# Patient Record
Sex: Female | Born: 2000 | Hispanic: Yes | Marital: Single | Smoking: Never smoker
Health system: Southern US, Community
[De-identification: ages and names within clinical notes are randomized; demographics above are authoritative.]

## PROBLEM LIST (undated history)

## (undated) DIAGNOSIS — Z8619 Personal history of other infectious and parasitic diseases: Secondary | ICD-10-CM

## (undated) HISTORY — DX: Personal history of other infectious and parasitic diseases: Z86.19

---
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
## 2020-12-13 ENCOUNTER — Other Ambulatory Visit: Payer: Self-pay

## 2020-12-13 ENCOUNTER — Other Ambulatory Visit (HOSPITAL_COMMUNITY): Payer: Self-pay | Admitting: Occupational Medicine

## 2020-12-13 ENCOUNTER — Ambulatory Visit (HOSPITAL_COMMUNITY)
Admission: RE | Admit: 2020-12-13 | Discharge: 2020-12-13 | Disposition: A | Discharge status: Home / Self Care | Payer: Self-pay | Source: Ambulatory Visit | Attending: Occupational Medicine | Admitting: Occupational Medicine

## 2020-12-13 DIAGNOSIS — R7611 Nonspecific reaction to tuberculin skin test without active tuberculosis: Secondary | ICD-10-CM

## 2021-01-23 ENCOUNTER — Other Ambulatory Visit: Payer: Self-pay

## 2021-01-23 ENCOUNTER — Emergency Department (HOSPITAL_COMMUNITY): Payer: Self-pay

## 2021-01-23 ENCOUNTER — Emergency Department (HOSPITAL_COMMUNITY)
Admission: EM | Admit: 2021-01-23 | Discharge: 2021-01-23 | Disposition: A | Discharge status: Home / Self Care | Payer: Self-pay | Attending: Emergency Medicine | Admitting: Emergency Medicine

## 2021-01-23 ENCOUNTER — Encounter (HOSPITAL_COMMUNITY): Payer: Self-pay | Admitting: Emergency Medicine

## 2021-01-23 DIAGNOSIS — B9689 Other specified bacterial agents as the cause of diseases classified elsewhere: Secondary | ICD-10-CM | POA: Insufficient documentation

## 2021-01-23 DIAGNOSIS — R000 Tachycardia, unspecified: Secondary | ICD-10-CM | POA: Insufficient documentation

## 2021-01-23 DIAGNOSIS — N12 Tubulo-interstitial nephritis, not specified as acute or chronic: Secondary | ICD-10-CM | POA: Insufficient documentation

## 2021-01-23 DIAGNOSIS — R519 Headache, unspecified: Secondary | ICD-10-CM | POA: Insufficient documentation

## 2021-01-23 LAB — URINALYSIS, ROUTINE W REFLEX MICROSCOPIC
Bilirubin Urine: NEGATIVE
Glucose, UA: NEGATIVE mg/dL
Ketones, ur: NEGATIVE mg/dL
Nitrite: NEGATIVE
Protein, ur: 30 mg/dL — AB
Specific Gravity, Urine: 1.023 (ref 1.005–1.030)
pH: 5 (ref 5.0–8.0)

## 2021-01-23 LAB — COMPREHENSIVE METABOLIC PANEL
ALT: 22 U/L (ref 0–44)
AST: 27 U/L (ref 15–41)
Albumin: 3.7 g/dL (ref 3.5–5.0)
Alkaline Phosphatase: 85 U/L (ref 38–126)
Anion gap: 10 (ref 5–15)
BUN: 11 mg/dL (ref 6–20)
CO2: 21 mmol/L — ABNORMAL LOW (ref 22–32)
Calcium: 8.3 mg/dL — ABNORMAL LOW (ref 8.9–10.3)
Chloride: 103 mmol/L (ref 98–111)
Creatinine, Ser: 0.61 mg/dL (ref 0.44–1.00)
GFR, Estimated: >60 mL/min (ref >60)
Glucose, Bld: 94 mg/dL (ref 70–99)
Potassium: 3.6 mmol/L (ref 3.5–5.1)
Sodium: 134 mmol/L — ABNORMAL LOW (ref 135–145)
Total Bilirubin: 0.8 mg/dL (ref 0.3–1.2)
Total Protein: 7.9 g/dL (ref 6.5–8.1)

## 2021-01-23 LAB — CBC WITH DIFFERENTIAL/PLATELET
Abs Immature Granulocytes: 0.06 10*3/uL (ref 0.00–0.07)
Basophils Absolute: 0.1 10*3/uL (ref 0.0–0.1)
Basophils Relative: 0 %
Eosinophils Absolute: 0 10*3/uL (ref 0.0–0.5)
Eosinophils Relative: 0 %
HCT: 35.6 % — ABNORMAL LOW (ref 36.0–46.0)
Hemoglobin: 11.2 g/dL — ABNORMAL LOW (ref 12.0–15.0)
Immature Granulocytes: 1 %
Lymphocytes Relative: 10 %
Lymphs Abs: 1.3 10*3/uL (ref 0.7–4.0)
MCH: 26 pg (ref 26.0–34.0)
MCHC: 31.5 g/dL (ref 30.0–36.0)
MCV: 82.6 fL (ref 80.0–100.0)
Monocytes Absolute: 1.1 10*3/uL — ABNORMAL HIGH (ref 0.1–1.0)
Monocytes Relative: 9 %
Neutro Abs: 9.9 10*3/uL — ABNORMAL HIGH (ref 1.7–7.7)
Neutrophils Relative %: 80 %
Platelets: 289 10*3/uL (ref 150–400)
RBC: 4.31 MIL/uL (ref 3.87–5.11)
RDW: 15.5 % (ref 11.5–15.5)
WBC: 12.3 10*3/uL — ABNORMAL HIGH (ref 4.0–10.5)
nRBC: 0 % (ref 0.0–0.2)

## 2021-01-23 LAB — PREGNANCY, URINE: Preg Test, Ur: NEGATIVE

## 2021-01-23 LAB — LIPASE, BLOOD: Lipase: 25 U/L (ref 11–51)

## 2021-01-23 MED ORDER — SODIUM CHLORIDE 0.9 % IV BOLUS
1000.0000 mL | Freq: Once | INTRAVENOUS | Status: AC
  Administered 2021-01-23: 1000 mL via INTRAVENOUS

## 2021-01-23 MED ORDER — SULFAMETHOXAZOLE-TRIMETHOPRIM 800-160 MG PO TABS: 1.0000 | ORAL_TABLET | Freq: Two times a day (BID) | ORAL | 0 refills | Status: AC

## 2021-01-23 MED ORDER — SODIUM CHLORIDE 0.9 % IV SOLN
1.0000 g | Freq: Once | INTRAVENOUS | Status: AC
  Administered 2021-01-23: 1 g via INTRAVENOUS
  Filled 2021-01-23: qty 10

## 2021-01-23 MED ORDER — ACETAMINOPHEN 500 MG PO TABS
1000.0000 mg | ORAL_TABLET | Freq: Once | ORAL | Status: AC
Start: 2021-01-23 — End: 2021-01-23
  Administered 2021-01-23: 1000 mg via ORAL
  Filled 2021-01-23: qty 2

## 2021-01-23 MED ORDER — IOHEXOL 300 MG/ML  SOLN
100.0000 mL | Freq: Once | INTRAMUSCULAR | Status: AC | PRN
  Administered 2021-01-23: 100 mL via INTRAVENOUS

## 2021-01-23 MED ORDER — IBUPROFEN 600 MG PO TABS: 600.0000 mg | ORAL_TABLET | Freq: Four times a day (QID) | ORAL | 0 refills | Status: AC | PRN

## 2021-01-23 MED ORDER — KETOROLAC TROMETHAMINE 30 MG/ML IJ SOLN
30.0000 mg | Freq: Once | INTRAMUSCULAR | Status: AC
  Administered 2021-01-23: 30 mg via INTRAVENOUS
  Filled 2021-01-23: qty 1

## 2021-01-23 NOTE — ED Triage Notes (Signed)
Pt c/o left flank pain since Friday and chills started yesterday. Pt states she has been having headaches a lot.

## 2021-01-23 NOTE — ED Provider Notes (Signed)
[facility] EMERGENCY DEPARTMENT Provider Note   CSN: [id number] Arrival date & time: 01/23/21  1857     History Chief Complaint  Patient presents with   Fever    Claudia Cook is a 20 y.o. female.  Pt presents to the ED today with fever and chills and abdominal pain.  Sx started Friday, 7/15.  She did have Covid about 2 weeks ago.  She works here in [employer] and has been working all day.  Sx getting worse, so she checked in.  She has had some headaches, but no sob.  No more cough.       History reviewed. No pertinent past medical history.  There are no problems to display for this patient.   History reviewed. No pertinent surgical history.   OB History   No obstetric history on file.     No family history on file.  Social History   Tobacco Use   Smoking status: Never   Smokeless tobacco: Never  Vaping Use   Vaping Use: Never used  Substance Use Topics   Alcohol use: Yes    Comment: occ   Drug use: Never    Home Medications Prior to Admission medications   Medication Sig Start Date End Date Taking? Authorizing Provider  ibuprofen (ADVIL) 600 MG tablet Take 1 tablet (600 mg total) by mouth every 6 (six) hours as needed. 01/23/21  Yes Isla Pence, MD  sulfamethoxazole-trimethoprim (BACTRIM DS) 800-160 MG tablet Take 1 tablet by mouth 2 (two) times daily for 7 days. 01/23/21 01/30/21 Yes Isla Pence, MD  ID NOW COVID-19 KIT TEST AS DIRECTED TODAY 01/15/21   [provider]    Allergies    Patient has no known allergies.  Review of Systems   Review of Systems  Constitutional:  Positive for chills and fever.  Gastrointestinal:  Positive for abdominal pain.  All other systems reviewed and are negative.  Physical Exam Updated Vital Signs BP 140/87 (BP Location: Right Arm)   Pulse (!) 122   Temp (!) 101.1 F (38.4 C) (Oral)   Resp 17   Ht _0  (1.6 m)   Wt 81.6 kg   LMP 11/18/2020   SpO2 100%   BMI 31.89 kg/m   Physical  Exam Vitals and nursing note reviewed.  Constitutional:      Appearance: Normal appearance.  HENT:     Head: Normocephalic and atraumatic.     Right Ear: External ear normal.     Left Ear: External ear normal.     Nose: Nose normal.     Mouth/Throat:     Mouth: Mucous membranes are dry.  Eyes:     Extraocular Movements: Extraocular movements intact.     Conjunctiva/sclera: Conjunctivae normal.     Pupils: Pupils are equal, round, and reactive to light.  Cardiovascular:     Rate and Rhythm: Regular rhythm. Tachycardia present.     Pulses: Normal pulses.     Heart sounds: Normal heart sounds.  Pulmonary:     Effort: Pulmonary effort is normal.     Breath sounds: Normal breath sounds.  Abdominal:     General: Abdomen is flat. Bowel sounds are normal.     Palpations: Abdomen is soft.     Tenderness: There is abdominal tenderness in the left lower quadrant.  Musculoskeletal:        General: Normal range of motion.     Cervical back: Normal range of motion and neck supple.  Skin:  General: Skin is warm.     Capillary Refill: Capillary refill takes less than 2 seconds.  Neurological:     General: No focal deficit present.     Mental Status: She is alert and oriented to person, place, and time.  Psychiatric:        Mood and Affect: Mood normal.        Behavior: Behavior normal.        Thought Content: Thought content normal.        Judgment: Judgment normal.    ED Results / Procedures / Treatments   Labs (all labs ordered are listed, but only abnormal results are displayed) Labs Reviewed  CBC WITH DIFFERENTIAL/PLATELET - Abnormal; Notable for the following components:      Result Value   WBC 12.3 (*)    Hemoglobin 11.2 (*)    HCT 35.6 (*)    Neutro Abs 9.9 (*)    Monocytes Absolute 1.1 (*)    All other components within normal limits  COMPREHENSIVE METABOLIC PANEL - Abnormal; Notable for the following components:   Sodium 134 (*)    CO2 21 (*)    Calcium 8.3 (*)     All other components within normal limits  URINALYSIS, ROUTINE W REFLEX MICROSCOPIC - Abnormal; Notable for the following components:   APPearance HAZY (*)    Hgb urine dipstick MODERATE (*)    Protein, ur 30 (*)    Leukocytes,Ua SMALL (*)    Bacteria, UA RARE (*)    All other components within normal limits  LIPASE, BLOOD  PREGNANCY, URINE    EKG None  Radiology CT Abdomen Pelvis W Contrast  Result Date: 01/23/2021 CLINICAL DATA:  Left-sided abdominal pain and nausea for 1 week EXAM: CT ABDOMEN AND PELVIS WITH CONTRAST TECHNIQUE: Multidetector CT imaging of the abdomen and pelvis was performed using the standard protocol following bolus administration of intravenous contrast. CONTRAST:  140m OMNIPAQUE IOHEXOL 300 MG/ML  SOLN COMPARISON:  None. FINDINGS: Lower chest: No acute pleural or parenchymal lung disease. Hepatobiliary: No focal liver abnormality is seen. No gallstones, gallbladder wall thickening, or biliary dilatation. Pancreas: Unremarkable. No pancreatic ductal dilatation or surrounding inflammatory changes. Spleen: Normal in size without focal abnormality. Adrenals/Urinary Tract: There is heterogeneous decreased enhancement mid left kidney, best seen on image 33, consistent with focal pyelonephritis. No evidence of renal abscess. The right kidney enhances normally. No urinary tract calculi or obstructive uropathy. The adrenals and bladder are unremarkable. Stomach/Bowel: No bowel obstruction or ileus. Normal appendix right lower quadrant. No bowel wall thickening or inflammatory change. Vascular/Lymphatic: No significant vascular findings are present. No enlarged abdominal or pelvic lymph nodes. Reproductive: Uterus and bilateral adnexa are unremarkable. Other: No free fluid or free intraperitoneal gas. No abdominal wall hernia. Musculoskeletal: No acute or destructive bony lesions. Reconstructed images demonstrate no additional findings. IMPRESSION: 1. Focal left pyelonephritis  without evidence of renal abscess. 2. Otherwise unremarkable CT of the abdomen and pelvis. Normal appendix. Electronically Signed   By: MRanda NgoM.D.   On: 01/23/2021 20:43   DG Chest Portable 1 View  Result Date: 01/23/2021 CLINICAL DATA:  Fever EXAM: PORTABLE CHEST 1 VIEW COMPARISON:  12/13/2020 FINDINGS: The heart size and mediastinal contours are within normal limits. Both lungs are clear. The visualized skeletal structures are unremarkable. IMPRESSION: No active disease. Electronically Signed   By: KDonavan FoilM.D.   On: 01/23/2021 20:07    Procedures Procedures   Medications Ordered in ED Medications  cefTRIAXone (ROCEPHIN)  1 g in sodium chloride 0.9 % 100 mL IVPB (has no administration in time range)  ketorolac (TORADOL) 30 MG/ML injection 30 mg (30 mg Intravenous Given 01/23/21 1943)  sodium chloride 0.9 % bolus 1,000 mL (1,000 mLs Intravenous New Bag/Given 01/23/21 1941)  iohexol (OMNIPAQUE) 300 MG/ML solution 100 mL (100 mLs Intravenous Contrast Given 01/23/21 2039)    ED Course  I have reviewed the triage vital signs and the nursing notes.  Pertinent labs & imaging results that were available during my care of the patient were reviewed by me and considered in my medical decision making (see chart for details).    MDM Rules/Calculators/A&P                           Pt does have a left pyelonephritis.  She is feeling better after fluids and fever tx.  She is given a dose of rocephin and then d/c.  She is to return if worse. Final Clinical Impression(s) / ED Diagnoses Final diagnoses:  Pyelonephritis    Rx / DC Orders ED Discharge Orders          Ordered    sulfamethoxazole-trimethoprim (BACTRIM DS) 800-160 MG tablet  2 times daily        01/23/21 2050    ibuprofen (ADVIL) 600 MG tablet  Every 6 hours PRN        01/23/21 2050             Isla Pence, MD 01/23/21 2051

## 2021-01-23 NOTE — ED Notes (Addendum)
md notified of temp, tylenol given. Pt reports decreased pain, states that she is ready to go home

## 2021-01-23 NOTE — ED Notes (Signed)
Pt was covid positive last week.

## 2022-01-27 ENCOUNTER — Ambulatory Visit
Admission: EM | Admit: 2022-01-27 | Discharge: 2022-01-27 | Disposition: A | Discharge status: Home / Self Care | Payer: BC Managed Care – PPO | Attending: Family Medicine | Admitting: Family Medicine

## 2022-01-27 DIAGNOSIS — N926 Irregular menstruation, unspecified: Secondary | ICD-10-CM

## 2022-01-27 LAB — POCT URINALYSIS DIP (MANUAL ENTRY)
Bilirubin, UA: NEGATIVE
Glucose, UA: NEGATIVE mg/dL
Ketones, POC UA: NEGATIVE mg/dL
Leukocytes, UA: NEGATIVE
Nitrite, UA: NEGATIVE
Spec Grav, UA: >=1.03 — AB (ref 1.010–1.025)
Urobilinogen, UA: 0.2 E.U./dL
pH, UA: 5.5 (ref 5.0–8.0)

## 2022-01-27 LAB — POCT URINE PREGNANCY: Preg Test, Ur: NEGATIVE

## 2022-01-27 MED ORDER — MEGESTROL ACETATE 40 MG PO TABS: 40.0000 mg | ORAL_TABLET | Freq: Every day | ORAL | 0 refills | Status: DC

## 2022-01-27 NOTE — ED Provider Notes (Signed)
[facility]    CSN: [id number] Arrival date & time: 01/27/22  1557      History   Chief Complaint Chief Complaint  Patient presents with   Vaginal Bleeding    HPI Claudia Cook is a 21 y.o. female.   Presenting today with extended period of vaginal bleeding following her menstrual period last month.  She states she had a normal type.  On June 20, then 3 days of no bleeding followed by ongoing waxing waning bleeding since.  She states the bleeding started getting heavier today.  Denies abdominal pain, pelvic pain, vaginal discharge or irritation, nausea, vomiting, bowel changes.  Has never had an issue like this in the past.  No new medications, supplements.  The only thing out of the ordinary recently is that she has been trying to lose some weight.    No past medical history on file.  There are no problems to display for this patient.   No past surgical history on file.  OB History   No obstetric history on file.      Home Medications    Prior to Admission medications   Medication Sig Start Date End Date Taking? Authorizing Provider  megestrol (MEGACE) 40 MG tablet Take 1 tablet (40 mg total) by mouth daily. 01/27/22  Yes Volney American, PA-C  ibuprofen (ADVIL) 600 MG tablet Take 1 tablet (600 mg total) by mouth every 6 (six) hours as needed. 01/23/21   Isla Pence, MD  ID NOW COVID-19 KIT TEST AS DIRECTED TODAY 01/15/21   [provider]    Family History No family history on file.  Social History Social History   Tobacco Use   Smoking status: Never   Smokeless tobacco: Never  Vaping Use   Vaping Use: Never used  Substance Use Topics   Alcohol use: Yes    Comment: occ   Drug use: Never     Allergies   Patient has no known allergies.   Review of Systems Review of Systems Per HPI  Physical Exam Triage Vital Signs ED Triage Vitals [01/27/22 1738]  Enc Vitals Group     BP 108/71     Pulse Rate 72     Resp 14      Temp 98 F (36.7 C)     Temp Source Oral     SpO2 98 %     Weight      Height      Head Circumference      Peak Flow      Pain Score      Pain Loc      Pain Edu?      Excl. in [place]?    No data found.  Updated Vital Signs BP 108/71 (BP Location: Right Arm)   Pulse 72   Temp 98 F (36.7 C) (Oral)   Resp 14   SpO2 98%   Visual Acuity Right Eye Distance:   Left Eye Distance:   Bilateral Distance:    Right Eye Near:   Left Eye Near:    Bilateral Near:     Physical Exam Vitals and nursing note reviewed.  Constitutional:      Appearance: Normal appearance. She is not ill-appearing.  HENT:     Head: Atraumatic.  Eyes:     Extraocular Movements: Extraocular movements intact.     Conjunctiva/sclera: Conjunctivae normal.  Cardiovascular:     Rate and Rhythm: Normal rate and regular rhythm.     Heart  sounds: Normal heart sounds.  Pulmonary:     Effort: Pulmonary effort is normal.     Breath sounds: Normal breath sounds.  Abdominal:     General: Bowel sounds are normal. There is no distension.     Palpations: Abdomen is soft.     Tenderness: There is no abdominal tenderness. There is no guarding.  Genitourinary:    Comments: GU exam declined Musculoskeletal:        General: Normal range of motion.     Cervical back: Normal range of motion and neck supple.  Skin:    General: Skin is warm and dry.  Neurological:     Mental Status: She is alert and oriented to person, place, and time.  Psychiatric:        Mood and Affect: Mood normal.        Thought Content: Thought content normal.        Judgment: Judgment normal.      UC Treatments / Results  Labs (all labs ordered are listed, but only abnormal results are displayed) Labs Reviewed  POCT URINALYSIS DIP (MANUAL ENTRY) - Abnormal; Notable for the following components:      Result Value   Spec Grav, UA >=1.030 (*)    Blood, UA large (*)    Protein Ur, POC trace (*)    All other components within normal  limits  POCT URINE PREGNANCY    EKG   Radiology No results found.  Procedures Procedures (including critical care time)  Medications Ordered in UC Medications - No data to display  Initial Impression / Assessment and Plan / UC Course  I have reviewed the triage vital signs and the nursing notes.  Pertinent labs & imaging results that were available during my care of the patient were reviewed by me and considered in my medical decision making (see chart for details).     Urinalysis without evidence of urinary tract infection, urine pregnant negative, vaginal swab declined.  Exam and vital signs benign and reassuring.  We will treat with Megace while waiting to establish care with OB/GYN which she states is next month.  Discussed iron supplement, fluids, close monitoring for worsening symptoms.  Final Clinical Impressions(s) / UC Diagnoses   Final diagnoses:  Abnormal menstruation     Discharge Instructions      Drink plenty of water, take an iron supplement and follow-up with OB/GYN as soon as possible.    ED Prescriptions     Medication Sig Dispense Auth. Provider   megestrol (MEGACE) 40 MG tablet Take 1 tablet (40 mg total) by mouth daily. 21 tablet Volney American, [place]      PDMP not reviewed this encounter.   Volney American, [place] 01/27/22 1815

## 2022-01-27 NOTE — ED Triage Notes (Signed)
Pt states she had a normal period on June 20 then began bleeding apprx 3 days after, denies excessive bleeding or clods, mild abdominal discomfort

## 2022-01-27 NOTE — Discharge Instructions (Signed)
Drink plenty of water, take an iron supplement and follow-up with OB/GYN as soon as possible.

## 2022-03-20 ENCOUNTER — Other Ambulatory Visit (HOSPITAL_COMMUNITY)
Admission: RE | Admit: 2022-03-20 | Discharge: 2022-03-20 | Disposition: A | Discharge status: Home / Self Care | Payer: BC Managed Care – PPO | Source: Ambulatory Visit | Attending: Obstetrics & Gynecology | Admitting: Obstetrics & Gynecology

## 2022-03-20 ENCOUNTER — Encounter: Payer: Self-pay | Admitting: Obstetrics & Gynecology

## 2022-03-20 ENCOUNTER — Ambulatory Visit (INDEPENDENT_AMBULATORY_CARE_PROVIDER_SITE_OTHER): Payer: BC Managed Care – PPO | Admitting: Obstetrics & Gynecology

## 2022-03-20 VITALS — BP 101/68 | HR 93 | Ht 63.0 in | Wt 219.8 lb

## 2022-03-20 DIAGNOSIS — Z30011 Encounter for initial prescription of contraceptive pills: Secondary | ICD-10-CM | POA: Diagnosis not present

## 2022-03-20 DIAGNOSIS — Z113 Encounter for screening for infections with a predominantly sexual mode of transmission: Secondary | ICD-10-CM

## 2022-03-20 DIAGNOSIS — Z01419 Encounter for gynecological examination (general) (routine) without abnormal findings: Secondary | ICD-10-CM

## 2022-03-20 MED ORDER — NORETHIN ACE-ETH ESTRAD-FE 1-20 MG-MCG(24) PO TABS: 1.0000 | ORAL_TABLET | Freq: Every day | ORAL | 4 refills | Status: DC

## 2022-03-20 NOTE — Progress Notes (Signed)
WELL-WOMAN EXAMINATION Patient name: Claudia Cook MRN [id number]  Date of birth: 2001-05-24 Chief Complaint:   Gynecologic Exam  History of Present Illness:   Claudia Cook is a 21 y.o. G0P0000  female being seen today for a routine well-woman exam and the following concerns:  Menses have been heavy for the past 3 mos- typically lasting about 6 days, changing super pad about 3x per day.  Then in August, she had a regular period, stopped for a few days then restarted with moderate to heavy bleeding.  Taking Megace when the bleeding is heavy and currently is still taking on occasion.  Sexually active for the past several months, not using protection.  She does not desire a pregnancy at this time.  Other than irregular bleeding, no other acute concerns.  Denies pelvic or abdominal pain.  Denies itching or irritation.  The current method of family planning is none.    Last pap collected today.  Last mammogram: n/a. Last colonoscopy: n/a     03/20/2022    3:01 PM  Depression screen PHQ 2/9  Decreased Interest 0  Down, Depressed, Hopeless 0  PHQ - 2 Score 0  Altered sleeping 0  Tired, decreased energy 1  Change in appetite 0  Feeling bad or failure about yourself  0  Trouble concentrating 0  Moving slowly or fidgety/restless 0  Suicidal thoughts 0  PHQ-9 Score 1    Review of Systems:   Pertinent items are noted in HPI Denies any headaches, blurred vision, fatigue, shortness of breath, chest pain, abdominal pain, bowel movements, urination, or intercourse unless otherwise stated above.  Pertinent History Reviewed:  Reviewed past medical,surgical, social and family history.  Reviewed problem list, medications and allergies. Physical Assessment:   Vitals:   03/20/22 1459  BP: 101/68  Pulse: 93  Weight: 219 lb 12.8 oz (99.7 kg)  Height: 5\' 3"  (1.6 m)  Body mass index is 38.94 kg/m.        Physical Examination:   General appearance - well appearing, and in no  distress  Mental status - alert, oriented to person, place, and time  Psych:  She has a normal mood and affect  Skin - warm and dry, normal color, no suspicious lesions noted  Chest - effort normal, all lung fields clear to auscultation bilaterally  Heart - normal rate and regular rhythm  Neck:  midline trachea, no thyromegaly or nodules  Breasts - breasts appear normal, no suspicious masses, no skin or nipple changes or  axillary nodes  Abdomen - obese, soft, nontender, nondistended, no masses or organomegaly  Pelvic - VULVA: normal appearing vulva with no masses, tenderness or lesions  VAGINA: normal appearing vagina with normal color and discharge, no lesions  CERVIX: normal appearing cervix without discharge or lesions, no CMT  Thin prep pap is done with HR HPV cotesting  UTERUS: uterus is felt to be normal size, shape, consistency and nontender   ADNEXA: No adnexal masses or tenderness noted.  Extremities:  No swelling or varicosities noted  Chaperone:  pt declined      Assessment & Plan:  1) Well-Woman Exam -pap collected, reviewed screening guidelines -STI screening to be completed  2) AUB/Contraceptive management -reviewed options both for contraception and to regulate her period -reviewed options from pill, patch, ring to LARCs -plan to start on OCPs, f/u in 3 mos.  OCP risk assessment: Pt denies personal history of VTE, stroke or heart attack.  Denies personal h/o breast cancer.  Pt is either a non-smoker or smoker under the age of 23yo.  Denies h/o migraines with aura  Meds ordered this encounter  Medications   Norethindrone Acetate-Ethinyl Estrad-FE (LOESTRIN 24 FE) 1-20 MG-MCG(24) tablet    Sig: Take 1 tablet by mouth daily.    Dispense:  90 tablet    Refill:  4    Follow-up: Return in about 3 months (around 06/19/2022) for Medication follow up.   Myna Hidalgo, DO Attending Obstetrician & Gynecologist, [facility] for [employer], [facility]

## 2022-03-21 LAB — RPR: RPR Ser Ql: NONREACTIVE

## 2022-03-21 LAB — HIV ANTIBODY (ROUTINE TESTING W REFLEX): HIV Screen 4th Generation wRfx: NONREACTIVE

## 2022-03-25 LAB — CYTOLOGY - PAP
Chlamydia: POSITIVE — AB
Comment: NEGATIVE
Comment: NORMAL
Diagnosis: NEGATIVE
Neisseria Gonorrhea: NEGATIVE

## 2022-03-26 ENCOUNTER — Other Ambulatory Visit: Payer: Self-pay | Admitting: Obstetrics & Gynecology

## 2022-03-26 DIAGNOSIS — A749 Chlamydial infection, unspecified: Secondary | ICD-10-CM

## 2022-03-26 MED ORDER — AZITHROMYCIN 500 MG PO TABS: 1000.0000 mg | ORAL_TABLET | Freq: Once | ORAL | 1 refills | Status: AC

## 2022-03-26 NOTE — Progress Notes (Signed)
Rx for Chlamydia treatment

## 2022-04-01 ENCOUNTER — Encounter: Payer: Self-pay | Admitting: Obstetrics & Gynecology

## 2022-04-02 ENCOUNTER — Emergency Department (HOSPITAL_COMMUNITY)
Admission: EM | Admit: 2022-04-02 | Discharge: 2022-04-03 | Disposition: A | Discharge status: Home / Self Care | Payer: BC Managed Care – PPO | Source: Home / Self Care | Attending: Emergency Medicine | Admitting: Emergency Medicine

## 2022-04-02 ENCOUNTER — Emergency Department (HOSPITAL_COMMUNITY)
Admission: EM | Admit: 2022-04-02 | Discharge: 2022-04-02 | Discharge status: Against Medical Advice | Payer: BC Managed Care – PPO | Attending: Emergency Medicine | Admitting: Emergency Medicine

## 2022-04-02 ENCOUNTER — Other Ambulatory Visit: Payer: Self-pay

## 2022-04-02 ENCOUNTER — Encounter (HOSPITAL_COMMUNITY): Payer: Self-pay | Admitting: *Deleted

## 2022-04-02 DIAGNOSIS — N939 Abnormal uterine and vaginal bleeding, unspecified: Secondary | ICD-10-CM | POA: Insufficient documentation

## 2022-04-02 DIAGNOSIS — Z5321 Procedure and treatment not carried out due to patient leaving prior to being seen by health care provider: Secondary | ICD-10-CM | POA: Insufficient documentation

## 2022-04-02 DIAGNOSIS — R110 Nausea: Secondary | ICD-10-CM | POA: Insufficient documentation

## 2022-04-02 DIAGNOSIS — R519 Headache, unspecified: Secondary | ICD-10-CM | POA: Diagnosis not present

## 2022-04-02 LAB — BASIC METABOLIC PANEL
Anion gap: 7 (ref 5–15)
BUN: 10 mg/dL (ref 6–20)
CO2: 23 mmol/L (ref 22–32)
Calcium: 8.4 mg/dL — ABNORMAL LOW (ref 8.9–10.3)
Chloride: 110 mmol/L (ref 98–111)
Creatinine, Ser: 0.65 mg/dL (ref 0.44–1.00)
GFR, Estimated: >60 mL/min (ref >60)
Glucose, Bld: 104 mg/dL — ABNORMAL HIGH (ref 70–99)
Potassium: 3.3 mmol/L — ABNORMAL LOW (ref 3.5–5.1)
Sodium: 140 mmol/L (ref 135–145)

## 2022-04-02 LAB — CBC
HCT: 33.8 % — ABNORMAL LOW (ref 36.0–46.0)
Hemoglobin: 10.5 g/dL — ABNORMAL LOW (ref 12.0–15.0)
MCH: 26.9 pg (ref 26.0–34.0)
MCHC: 31.1 g/dL (ref 30.0–36.0)
MCV: 86.7 fL (ref 80.0–100.0)
Platelets: 317 10*3/uL (ref 150–400)
RBC: 3.9 MIL/uL (ref 3.87–5.11)
RDW: 13.9 % (ref 11.5–15.5)
WBC: 8.6 10*3/uL (ref 4.0–10.5)
nRBC: 0 % (ref 0.0–0.2)

## 2022-04-02 LAB — CBC WITH DIFFERENTIAL/PLATELET
Abs Immature Granulocytes: 0.02 10*3/uL (ref 0.00–0.07)
Basophils Absolute: 0 10*3/uL (ref 0.0–0.1)
Basophils Relative: 1 %
Eosinophils Absolute: 0.2 10*3/uL (ref 0.0–0.5)
Eosinophils Relative: 3 %
HCT: 35.5 % — ABNORMAL LOW (ref 36.0–46.0)
Hemoglobin: 11.4 g/dL — ABNORMAL LOW (ref 12.0–15.0)
Immature Granulocytes: 0 %
Lymphocytes Relative: 30 %
Lymphs Abs: 2.2 10*3/uL (ref 0.7–4.0)
MCH: 27.3 pg (ref 26.0–34.0)
MCHC: 32.1 g/dL (ref 30.0–36.0)
MCV: 85.1 fL (ref 80.0–100.0)
Monocytes Absolute: 0.4 10*3/uL (ref 0.1–1.0)
Monocytes Relative: 5 %
Neutro Abs: 4.5 10*3/uL (ref 1.7–7.7)
Neutrophils Relative %: 61 %
Platelets: 335 10*3/uL (ref 150–400)
RBC: 4.17 MIL/uL (ref 3.87–5.11)
RDW: 13.8 % (ref 11.5–15.5)
WBC: 7.4 10*3/uL (ref 4.0–10.5)
nRBC: 0 % (ref 0.0–0.2)

## 2022-04-02 LAB — TYPE AND SCREEN
ABO/RH(D): O POS
Antibody Screen: NEGATIVE

## 2022-04-02 NOTE — ED Triage Notes (Signed)
Pt with vaginal bleeding x 2 months, started on birth control 2 weeks ago and passing large clots. + nausea and HA.

## 2022-04-02 NOTE — ED Provider Triage Note (Signed)
Emergency Medicine Provider Triage Evaluation Note  Claudia Cook , a 21 y.o. female  was evaluated in triage.  Pt complains of persistent vaginal bleeding.  Patient states she has had it for several weeks now.  She did see an OB/GYN doctor and was started on birth control pills.  She feels like her bleeding has gotten worse..  She sent a message to her OB/GYN doctor and they told her this is not unusual after starting birth control pills.  Patient states she continue to pass clots so she came to the ED  Review of Systems  Positive: Vaginal bleeding Negative: Abdominal pain  Physical Exam  BP (!) 142/69 (BP Location: Right Arm)   Pulse 73   Temp 98.1 F (36.7 C) (Oral)   Resp 20   Ht 1.6 m (5\' 3" )   Wt 100.2 kg   SpO2 99%   BMI 39.15 kg/m  Gen:   Awake, no distress   Resp:  Normal effort  MSK:   Moves extremities without difficulty  Other:    Medical Decision Making  Medically screening exam initiated at 10:44 PM.  Appropriate orders placed.  PATRCIA SCHNEPP was informed that the remainder of the evaluation will be completed by another provider, this initial triage assessment does not replace that evaluation, and the importance of remaining in the ED until their evaluation is complete.  We will check CBC to rule out severe anemia.   Dorie Rank, MD 04/02/22 2245

## 2022-04-02 NOTE — ED Triage Notes (Signed)
Pt states her vaginal bleeding is worse.  Pt states she is having to change her pad every hour. Clots are getting worse.

## 2022-04-03 LAB — HCG, QUANTITATIVE, PREGNANCY: hCG, Beta Chain, Quant, S: <1 m[IU]/mL (ref <5)

## 2022-04-03 NOTE — ED Provider Notes (Signed)
[facility] EMERGENCY DEPARTMENT Provider Note   CSN: [id number] Arrival date & time: 04/02/22  2033     History  Chief Complaint  Patient presents with   Vaginal Bleeding    Claudia Cook is a 21 y.o. female.  This patient is a 21 year old female with history of irregular menstrual bleeding.  Patient was started 2 weeks ago on oral contraceptives to attempt to control her cycle.  Since taking this medication she reports passing clots.  She denies to me that she is having any fevers, chills, or abdominal pain.  She did just have a pelvic exam performed prior to be starting on these medications.  She denies any discharge.  She reports that is difficult to work due to this bleeding.  The history is provided by the patient.       Home Medications Prior to Admission medications   Medication Sig Start Date End Date Taking? Authorizing Provider  ibuprofen (ADVIL) 600 MG tablet Take 1 tablet (600 mg total) by mouth every 6 (six) hours as needed. Patient not taking: Reported on 03/20/2022 01/23/21   Isla Pence, MD  Norethindrone Acetate-Ethinyl Estrad-FE (LOESTRIN 24 FE) 1-20 MG-MCG(24) tablet Take 1 tablet by mouth daily. 03/20/22 06/18/22  Janyth Pupa, DO      Allergies    Patient has no known allergies.    Review of Systems   Review of Systems  All other systems reviewed and are negative.   Physical Exam Updated Vital Signs BP (!) 142/69 (BP Location: Right Arm)   Pulse 73   Temp 98.1 F (36.7 C) (Oral)   Resp 20   Ht 5\' 3"  (1.6 m)   Wt 100.2 kg   SpO2 99%   BMI 39.15 kg/m  Physical Exam Vitals and nursing note reviewed.  Constitutional:      General: She is not in acute distress.    Appearance: She is well-developed. She is not diaphoretic.  HENT:     Head: Normocephalic and atraumatic.  Cardiovascular:     Rate and Rhythm: Normal rate and regular rhythm.     Heart sounds: No murmur heard.    No friction rub. No gallop.  Pulmonary:     Effort:  Pulmonary effort is normal. No respiratory distress.     Breath sounds: Normal breath sounds. No wheezing.  Abdominal:     General: Bowel sounds are normal. There is no distension.     Palpations: Abdomen is soft.     Tenderness: There is no abdominal tenderness.  Musculoskeletal:        General: Normal range of motion.     Cervical back: Normal range of motion and neck supple.  Skin:    General: Skin is warm and dry.  Neurological:     General: No focal deficit present.     Mental Status: She is alert and oriented to person, place, and time.     ED Results / Procedures / Treatments   Labs (all labs ordered are listed, but only abnormal results are displayed) Labs Reviewed  CBC - Abnormal; Notable for the following components:      Result Value   Hemoglobin 10.5 (*)    HCT 33.8 (*)    All other components within normal limits  BASIC METABOLIC PANEL - Abnormal; Notable for the following components:   Potassium 3.3 (*)    Glucose, Bld 104 (*)    Calcium 8.4 (*)    All other components within normal limits  HCG, QUANTITATIVE,  PREGNANCY  PREGNANCY, URINE  TYPE AND SCREEN    EKG None  Radiology No results found.  Procedures Procedures    Medications Ordered in ED Medications - No data to display  ED Course/ Medical Decision Making/ A&P  Patient presenting with vaginal bleeding as described in the HPI.  Patient appears hemodynamically stable and is not actively bleeding here in the ER.  Her hemoglobin is 10.5 which is slightly down from her baseline of 11.  Pregnancy test is also negative, thus ruling out ectopic.  At this point, I do not feel as though any further emergent work-up is indicated.  I will make arrangements for the patient to have an outpatient ultrasound in the morning.  She is to continue taking the oral contraceptives as I believe these may require a little more time for her cycle to equilibrate.  Final Clinical Impression(s) / ED Diagnoses Final  diagnoses:  None    Rx / DC Orders ED Discharge Orders     None         Veryl Speak, MD 04/03/22 0101

## 2022-04-03 NOTE — Discharge Instructions (Signed)
Continue your oral contraceptives as previously prescribed.  Return tomorrow for an ultrasound to further evaluate your uterus.

## 2022-04-10 ENCOUNTER — Ambulatory Visit (HOSPITAL_COMMUNITY)
Admission: RE | Admit: 2022-04-10 | Discharge: 2022-04-10 | Disposition: A | Discharge status: Home / Self Care | Payer: BC Managed Care – PPO | Source: Ambulatory Visit | Attending: Emergency Medicine | Admitting: Emergency Medicine

## 2022-04-10 DIAGNOSIS — N926 Irregular menstruation, unspecified: Secondary | ICD-10-CM | POA: Diagnosis not present

## 2022-04-10 DIAGNOSIS — N939 Abnormal uterine and vaginal bleeding, unspecified: Secondary | ICD-10-CM | POA: Diagnosis not present

## 2022-04-17 ENCOUNTER — Ambulatory Visit: Payer: BC Managed Care – PPO | Admitting: Obstetrics & Gynecology

## 2022-05-14 ENCOUNTER — Ambulatory Visit: Payer: Self-pay | Admitting: Obstetrics & Gynecology

## 2022-05-14 DIAGNOSIS — Z539 Procedure and treatment not carried out, unspecified reason: Secondary | ICD-10-CM

## 2022-05-15 IMAGING — DX DG CHEST 2V
2 series · 2 of 2 positions shown · non-contrast
Comparison: None.

CLINICAL DATA: 19-year-old female with positive TB test

EXAM:
CHEST - 2 VIEW

[w chest pa]
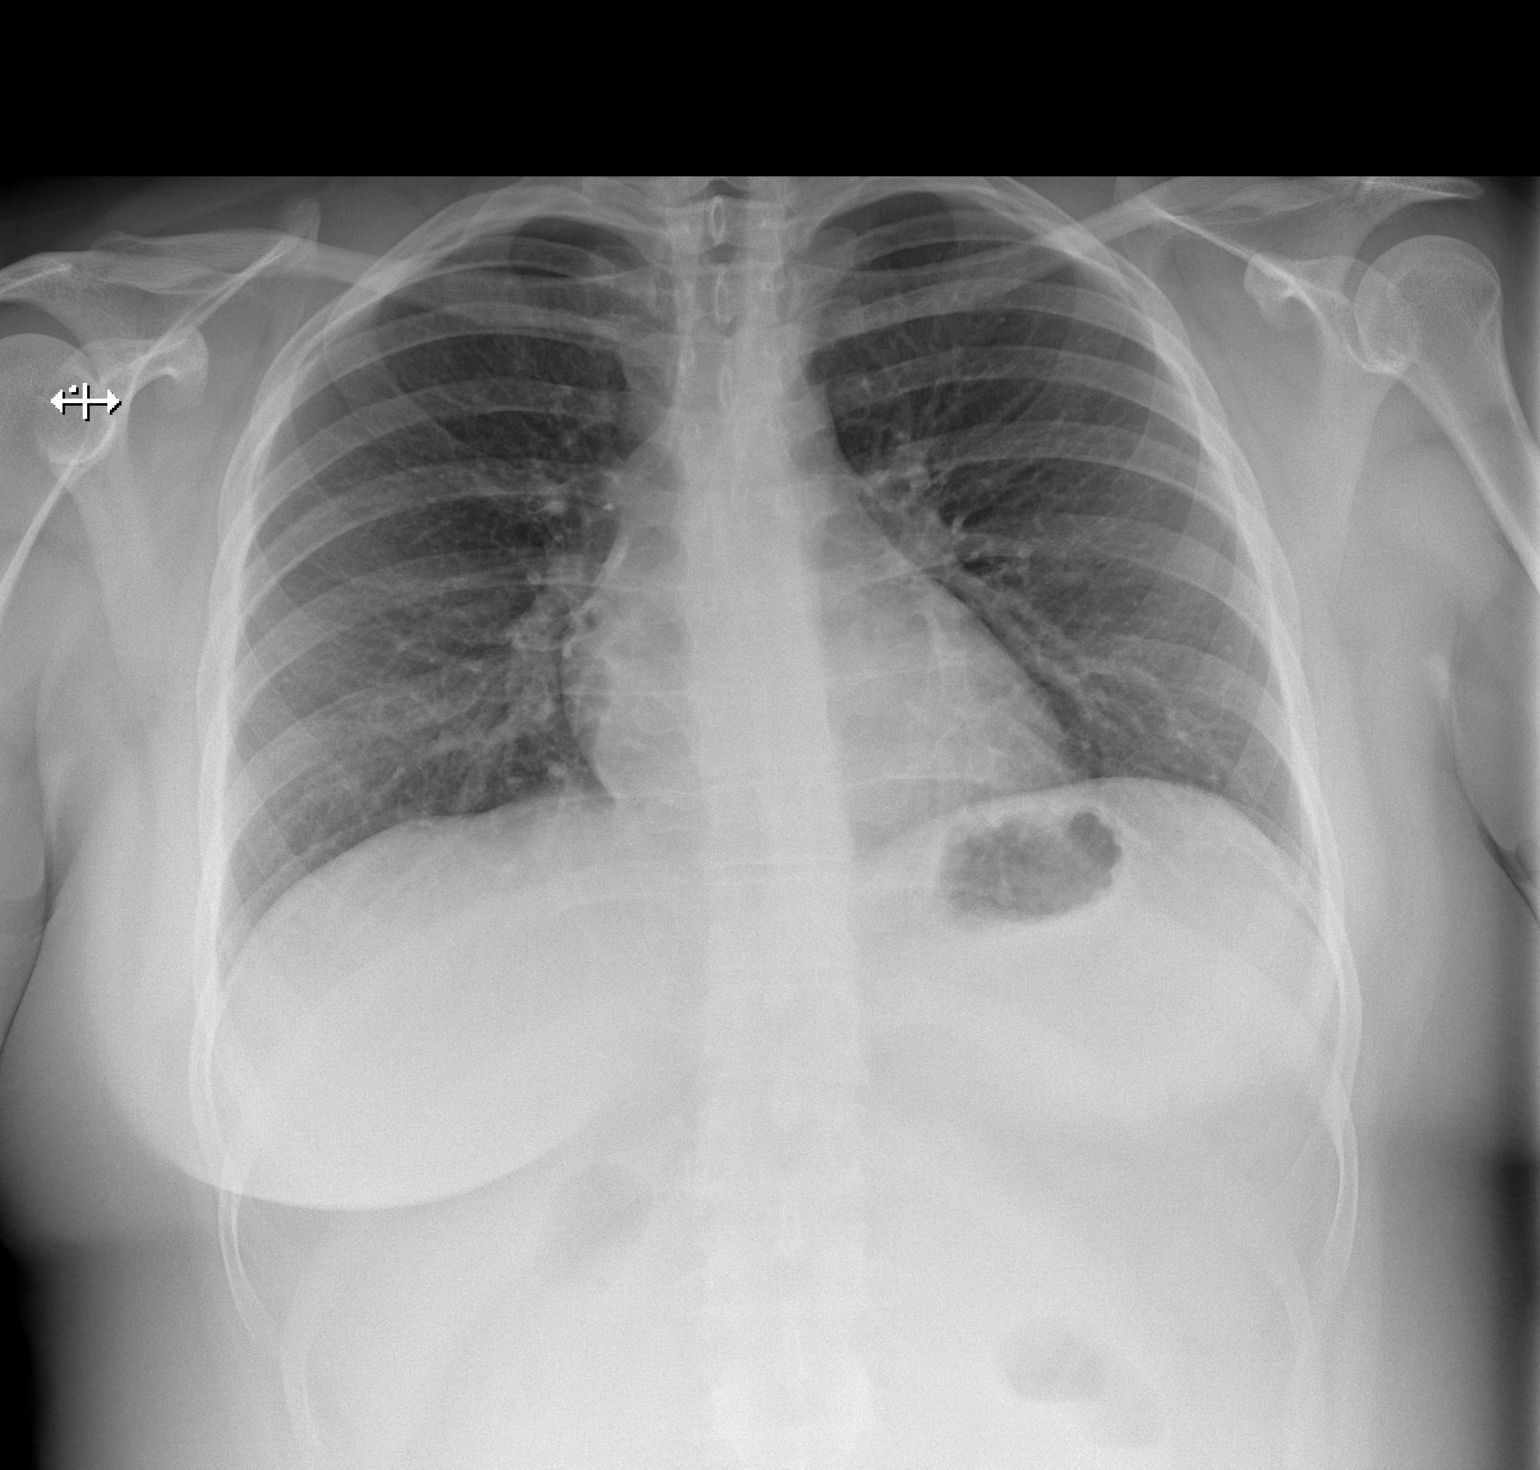

[w chest lat]
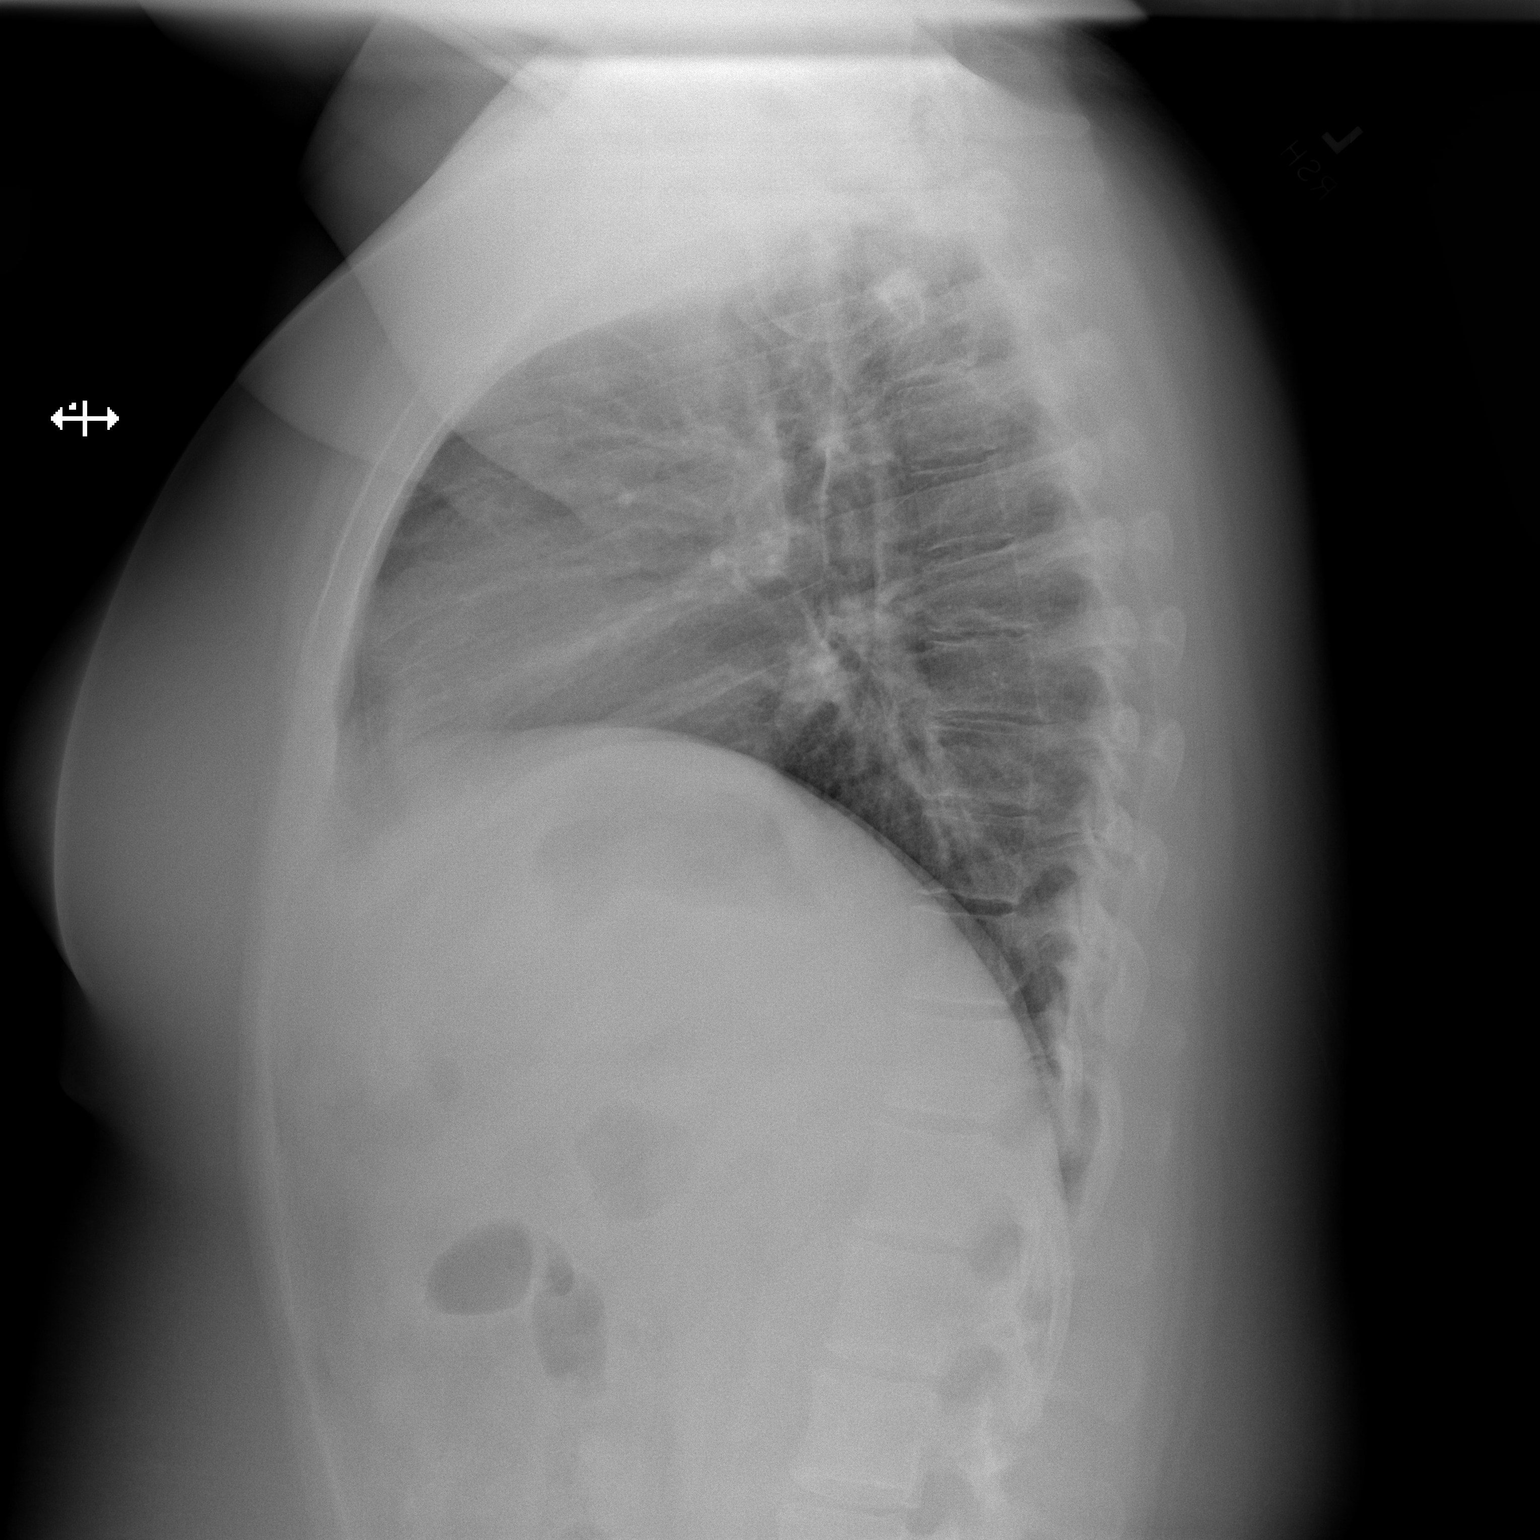

[2 of 2 positions shown; findings below may reference images not displayed]

FINDINGS: Cardiomediastinal silhouette within normal limits in size and
contour. No evidence of central vascular congestion. No interlobular
septal thickening. No pneumothorax or pleural effusion. No confluent
airspace disease.

No acute displaced fracture
IMPRESSION: Negative for acute cardiopulmonary disease

## 2022-06-18 ENCOUNTER — Encounter: Payer: Self-pay | Admitting: Obstetrics & Gynecology

## 2022-06-18 ENCOUNTER — Ambulatory Visit (INDEPENDENT_AMBULATORY_CARE_PROVIDER_SITE_OTHER): Payer: BC Managed Care – PPO | Admitting: Obstetrics & Gynecology

## 2022-06-18 VITALS — BP 125/75 | HR 58 | Ht 63.0 in | Wt 219.8 lb

## 2022-06-18 DIAGNOSIS — R300 Dysuria: Secondary | ICD-10-CM

## 2022-06-18 DIAGNOSIS — Z113 Encounter for screening for infections with a predominantly sexual mode of transmission: Secondary | ICD-10-CM | POA: Diagnosis not present

## 2022-06-18 DIAGNOSIS — Z30016 Encounter for initial prescription of transdermal patch hormonal contraceptive device: Secondary | ICD-10-CM

## 2022-06-18 DIAGNOSIS — Z09 Encounter for follow-up examination after completed treatment for conditions other than malignant neoplasm: Secondary | ICD-10-CM | POA: Diagnosis not present

## 2022-06-18 MED ORDER — NORELGESTROMIN-ETH ESTRADIOL 150-35 MCG/24HR TD PTWK: 1.0000 | MEDICATED_PATCH | TRANSDERMAL | 4 refills | Status: DC

## 2022-06-18 NOTE — Progress Notes (Signed)
,    GYN VISIT Patient name: Claudia Cook MRN [id number]  Date of birth: 2001/06/05 Chief Complaint:   Follow-up  History of Present Illness:   Claudia Cook is a 21 y.o. G0P0000 female being seen today for follow up regarding:  Contraceptive management: Doing well with pill, but struggling with compliance. Menses are now every month- lasting about 3 days, no heavy bleeding.  Had one episode of intermenstrual bleeding after missing a pill.  While she likes the pill and the change in her menses, she is concerned about compliance and wants to consider alternative options.  Dysuria: About 1-2wks ago burning with urination.  Denies hematuria.  Notes pelvic pain.  Denies fever/chills.  No other acute complaints  -h/o Chlamydia- s/p treatment, asymptomatic   No LMP recorded. (Menstrual status: Oral contraceptives).     03/20/2022    3:01 PM  Depression screen PHQ 2/9  Decreased Interest 0  Down, Depressed, Hopeless 0  PHQ - 2 Score 0  Altered sleeping 0  Tired, decreased energy 1  Change in appetite 0  Feeling bad or failure about yourself  0  Trouble concentrating 0  Moving slowly or fidgety/restless 0  Suicidal thoughts 0  PHQ-9 Score 1     Review of Systems:   Pertinent items are noted in HPI Denies fever/chills, dizziness, headaches, visual disturbances, fatigue, shortness of breath, chest pain, abdominal pain, vomiting, no problems with periods, bowel movements, urination, or intercourse unless otherwise stated above.  Pertinent History Reviewed:  Reviewed past medical,surgical, social, obstetrical and family history.  Reviewed problem list, medications and allergies. Physical Assessment:   Vitals:   06/18/22 1352  BP: 125/75  Pulse: (!) 58  Weight: 219 lb 12.8 oz (99.7 kg)  Height: 5\' 3"  (1.6 m)  Body mass index is 38.94 kg/m.       Physical Examination:   General appearance: alert, well appearing, and in no distress  Psych: mood appropriate, normal  affect  Skin: warm & dry   Cardiovascular: normal heart rate noted  Respiratory: normal respiratory effort, no distress  Abdomen: soft, non-tender   Back: no CVA tenderness  Pelvic: examination not indicated  Extremities: no edema   Chaperone: N/A    Assessment & Plan:  1) Contraceptive management - reviewed options, plan to transition to patch -reviewed usage, should she note any issues or concerns contact office  2) Dysuria -plan for UA/culture further management pending results  3) h/o Chlamydia- TOC today   Orders Placed This Encounter  Procedures   Urine Culture   GC/Chlamydia Probe Amp    Return for Sept annual.   04-26-2005, DO Attending Obstetrician & Gynecologist, [facility], [facility]

## 2022-06-20 LAB — GC/CHLAMYDIA PROBE AMP
Chlamydia trachomatis, NAA: POSITIVE — AB
Neisseria Gonorrhoeae by PCR: NEGATIVE

## 2022-06-20 LAB — URINE CULTURE

## 2022-06-21 ENCOUNTER — Other Ambulatory Visit: Payer: Self-pay | Admitting: Obstetrics & Gynecology

## 2022-06-21 DIAGNOSIS — A749 Chlamydial infection, unspecified: Secondary | ICD-10-CM

## 2022-06-21 MED ORDER — DOXYCYCLINE HYCLATE 100 MG PO TABS: 100.0000 mg | ORAL_TABLET | Freq: Two times a day (BID) | ORAL | 1 refills | Status: AC

## 2023-02-12 ENCOUNTER — Ambulatory Visit: Payer: BC Managed Care – PPO | Admitting: Obstetrics & Gynecology

## 2023-09-22 ENCOUNTER — Ambulatory Visit (INDEPENDENT_AMBULATORY_CARE_PROVIDER_SITE_OTHER): Payer: Self-pay | Admitting: Obstetrics & Gynecology

## 2023-09-22 ENCOUNTER — Other Ambulatory Visit (HOSPITAL_COMMUNITY)
Admission: RE | Admit: 2023-09-22 | Discharge: 2023-09-22 | Disposition: A | Discharge status: Home / Self Care | Payer: Self-pay | Source: Ambulatory Visit | Attending: Obstetrics & Gynecology | Admitting: Obstetrics & Gynecology

## 2023-09-22 ENCOUNTER — Encounter: Payer: Self-pay | Admitting: Obstetrics & Gynecology

## 2023-09-22 VITALS — BP 114/74 | HR 92 | Ht 63.0 in | Wt 239.2 lb

## 2023-09-22 DIAGNOSIS — Z113 Encounter for screening for infections with a predominantly sexual mode of transmission: Secondary | ICD-10-CM

## 2023-09-22 DIAGNOSIS — Z30011 Encounter for initial prescription of contraceptive pills: Secondary | ICD-10-CM

## 2023-09-22 DIAGNOSIS — Z1331 Encounter for screening for depression: Secondary | ICD-10-CM

## 2023-09-22 DIAGNOSIS — Z01411 Encounter for gynecological examination (general) (routine) with abnormal findings: Secondary | ICD-10-CM | POA: Insufficient documentation

## 2023-09-22 DIAGNOSIS — N939 Abnormal uterine and vaginal bleeding, unspecified: Secondary | ICD-10-CM

## 2023-09-22 MED ORDER — NORETHIN ACE-ETH ESTRAD-FE 1-20 MG-MCG(24) PO TABS: 1.0000 | ORAL_TABLET | Freq: Every day | ORAL | 4 refills | Status: AC

## 2023-09-22 NOTE — Addendum Note (Signed)
 Addended by: Moss Mc on: 09/22/2023 10:25 AM   Modules accepted: Orders

## 2023-09-22 NOTE — Progress Notes (Signed)
 WELL-WOMAN EXAMINATION Patient name: Claudia Cook MRN [id number]  Date of birth: 2000/10/05 Chief Complaint:   Annual Exam (Periods becoming irregular)  History of Present Illness:   Claudia Cook is a 23 y.o. G0P0000 female being seen today for a routine well-woman exam.    Stopped patch before the summertime.   Sept menses then not again to December.  Since Dec having irregular bleeding- sometimes period will last for more than 2 weeks.  She will stop bleeding for about 4-5 days and the bleeding will restart.  Bleeding has been light- typically 2-3 pads per day.  On occasion she has been sexually active without protection, but states she does not desire pregnancy at this time.  She is somewhat concerned as to why she has not gotten pregnant.  She is concerned about PCOS.  Prior ultrasound reviewed completed October 2023.  No acute abnormalities noted.  Normal ovaries bilaterally.  Normal size uterus without any abnormalities  Denies vaginal discharge itching or irritation.  Denies pelvic or abdominal pain.  Patient's last menstrual period was 09/10/2023.  The current method of family planning is none.    Last pap 2023.  Last mammogram: NA. Last colonoscopy: NA     09/22/2023    8:39 AM 03/20/2022    3:01 PM  Depression screen PHQ 2/9  Decreased Interest 0 0  Down, Depressed, Hopeless 1 0  PHQ - 2 Score 1 0  Altered sleeping 0 0  Tired, decreased energy 1 1  Change in appetite 0 0  Feeling bad or failure about yourself  0 0  Trouble concentrating 0 0  Moving slowly or fidgety/restless 0 0  Suicidal thoughts 0 0  PHQ-9 Score 2 1      Review of Systems:   Pertinent items are noted in HPI Denies any headaches, blurred vision, fatigue, shortness of breath, chest pain, abdominal pain, bowel movements, urination, or intercourse unless otherwise stated above.  Pertinent History Reviewed:  Reviewed past medical,surgical, social and family history.  Reviewed  problem list, medications and allergies. Physical Assessment:   Vitals:   09/22/23 0832  BP: 114/74  Pulse: 92  Weight: 239 lb 3.2 oz (108.5 kg)  Height: 5\' 3"  (1.6 m)  Body mass index is 42.37 kg/m.        Physical Examination:   General appearance - well appearing, and in no distress  Mental status - alert, oriented to person, place, and time  Psych:  She has a normal mood and affect  Skin - warm and dry, normal color, no suspicious lesions noted  Chest - effort normal, all lung fields clear to auscultation bilaterally  Heart - normal rate and regular rhythm  Neck:  midline trachea, no thyromegaly or nodules  Breasts - breasts appear normal, no suspicious masses, no skin or nipple changes or  axillary nodes  Abdomen - soft, nontender, nondistended, no masses or organomegaly  Pelvic - VULVA: normal appearing vulva with no masses, tenderness or lesions  VAGINA: normal appearing vagina with normal color and discharge, no lesions  CERVIX: normal appearing cervix without discharge or lesions, no CMT  UTERUS: uterus is felt to be normal size, shape, consistency and nontender   ADNEXA: No adnexal masses or tenderness noted.  Extremities:  No swelling or varicosities noted  Chaperone: Jobe Marker     Assessment & Plan:  1) Well-Woman Exam -Reviewed ASCCP guidelines, Pap up to date -Patient requesting STI screening  2) AUB -Plan to rule out underlying  etiology -Reviewed prior ultrasound with patient that did not show acute abnormalities -Discussed her concern of PCOS.  Based on her current menses and prior ultrasound she does not meet definition of PCOS.  However discussed concern for potential anovulation due to irregular bleeding -Discussed that the "fork in the road" is whether she is trying to conceive or not as this would change current recommendations for workup and management -As mentioned she does not desire pregnancy, recommendation to proceed with contraceptive medical  management for both contraception and to regulate her menses -Patient agreeable and will plan to restart pills  Orders Placed This Encounter  Procedures   TSH   Prolactin   RPR   HIV Antibody (routine testing w rflx)   HgB A1c   Meds ordered this encounter  Medications   Norethindrone Acetate-Ethinyl Estrad-FE (LOESTRIN 24 FE) 1-20 MG-MCG(24) tablet    Sig: Take 1 tablet by mouth daily.    Dispense:  90 tablet    Refill:  4    Follow-up: Return in about 1 year (around 09/21/2024) for Annual.   Myna Hidalgo, DO Attending Obstetrician & Gynecologist, [facility], [facility]

## 2023-09-23 LAB — HEMOGLOBIN A1C
Est. average glucose Bld gHb Est-mCnc: 114 mg/dL
Hgb A1c MFr Bld: 5.6 % (ref 4.8–5.6)

## 2023-09-23 LAB — RPR: RPR Ser Ql: NONREACTIVE

## 2023-09-23 LAB — CERVICOVAGINAL ANCILLARY ONLY
Chlamydia: NEGATIVE
Comment: NEGATIVE
Comment: NEGATIVE
Comment: NORMAL
Neisseria Gonorrhea: NEGATIVE
Trichomonas: NEGATIVE

## 2023-09-23 LAB — PROLACTIN: Prolactin: 33.3 ng/mL (ref 4.8–33.4)

## 2023-09-23 LAB — HIV ANTIBODY (ROUTINE TESTING W REFLEX): HIV Screen 4th Generation wRfx: NONREACTIVE

## 2023-09-23 LAB — TSH: TSH: 1.76 u[IU]/mL (ref 0.450–4.500)
